# Patient Record
Sex: Male | Born: 1940
Health system: Southern US, Community
[De-identification: ages and names within clinical notes are randomized; demographics above are authoritative.]

## PROBLEM LIST (undated history)

## (undated) DIAGNOSIS — E785 Hyperlipidemia, unspecified: Secondary | ICD-10-CM

## (undated) DIAGNOSIS — C801 Malignant (primary) neoplasm, unspecified: Secondary | ICD-10-CM

## (undated) DIAGNOSIS — I10 Essential (primary) hypertension: Secondary | ICD-10-CM

---

## 2005-04-13 ENCOUNTER — Ambulatory Visit: Admission: RE | Admit: 2005-04-13 | Discharge: 2005-05-04 | Payer: Self-pay | Admitting: Radiation Oncology

## 2005-06-29 ENCOUNTER — Ambulatory Visit: Admission: RE | Admit: 2005-06-29 | Discharge: 2005-09-27 | Payer: Self-pay | Admitting: Radiation Oncology

## 2005-06-29 ENCOUNTER — Encounter: Admission: RE | Admit: 2005-06-29 | Discharge: 2005-06-29 | Payer: Self-pay | Admitting: Urology

## 2005-08-01 ENCOUNTER — Ambulatory Visit (HOSPITAL_BASED_OUTPATIENT_CLINIC_OR_DEPARTMENT_OTHER): Admission: RE | Admit: 2005-08-01 | Discharge: 2005-08-01 | Payer: Self-pay | Admitting: Urology

## 2007-01-10 IMAGING — CR DG CHEST 2V
2 series · 2 of 2 positions shown · non-contrast
Comparison: None.

CLINICAL DATA: 64 year old with prostate cancer.  Pre op respiratory exam.
 CHEST ? 2 VIEW:

[w chest pa]
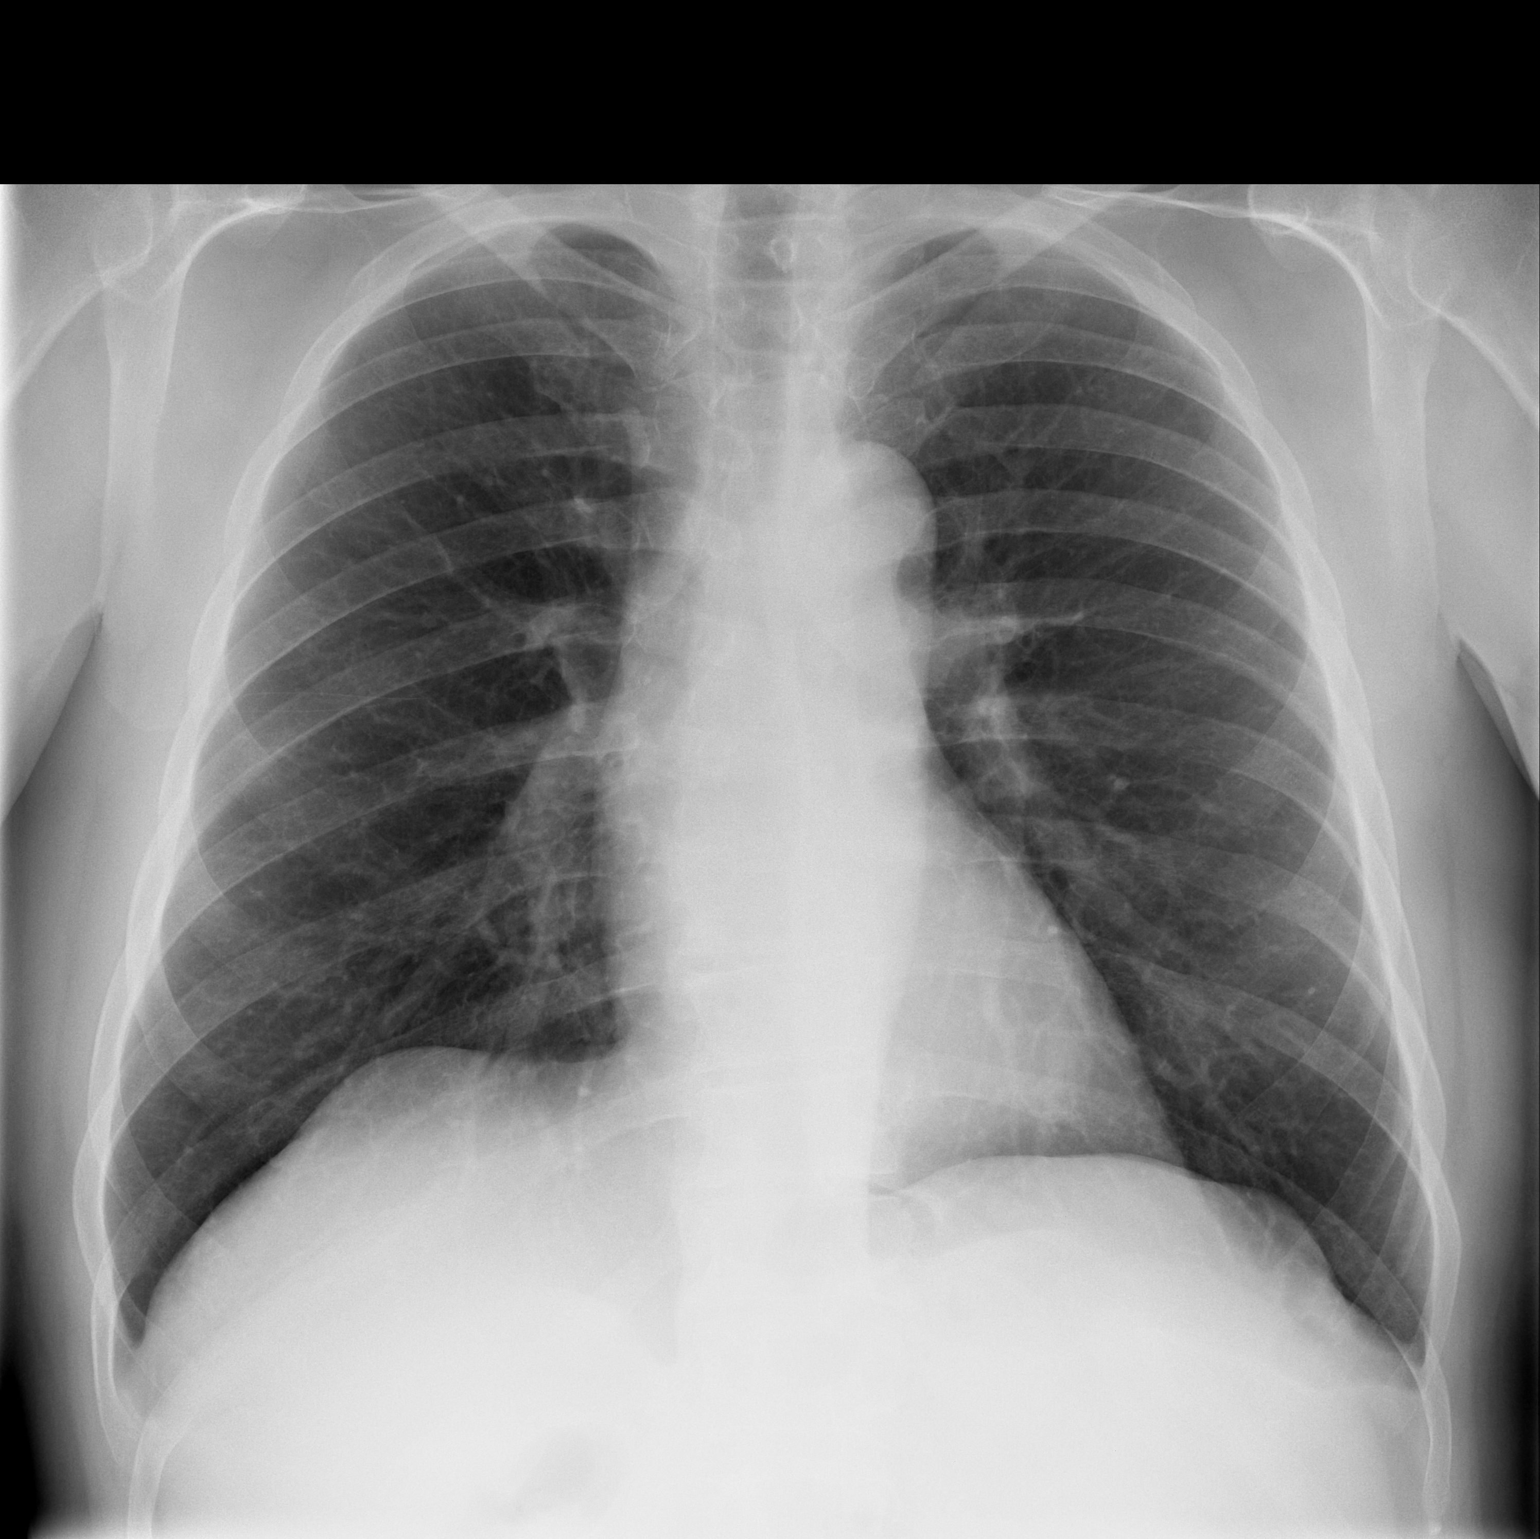

[w chest lat]
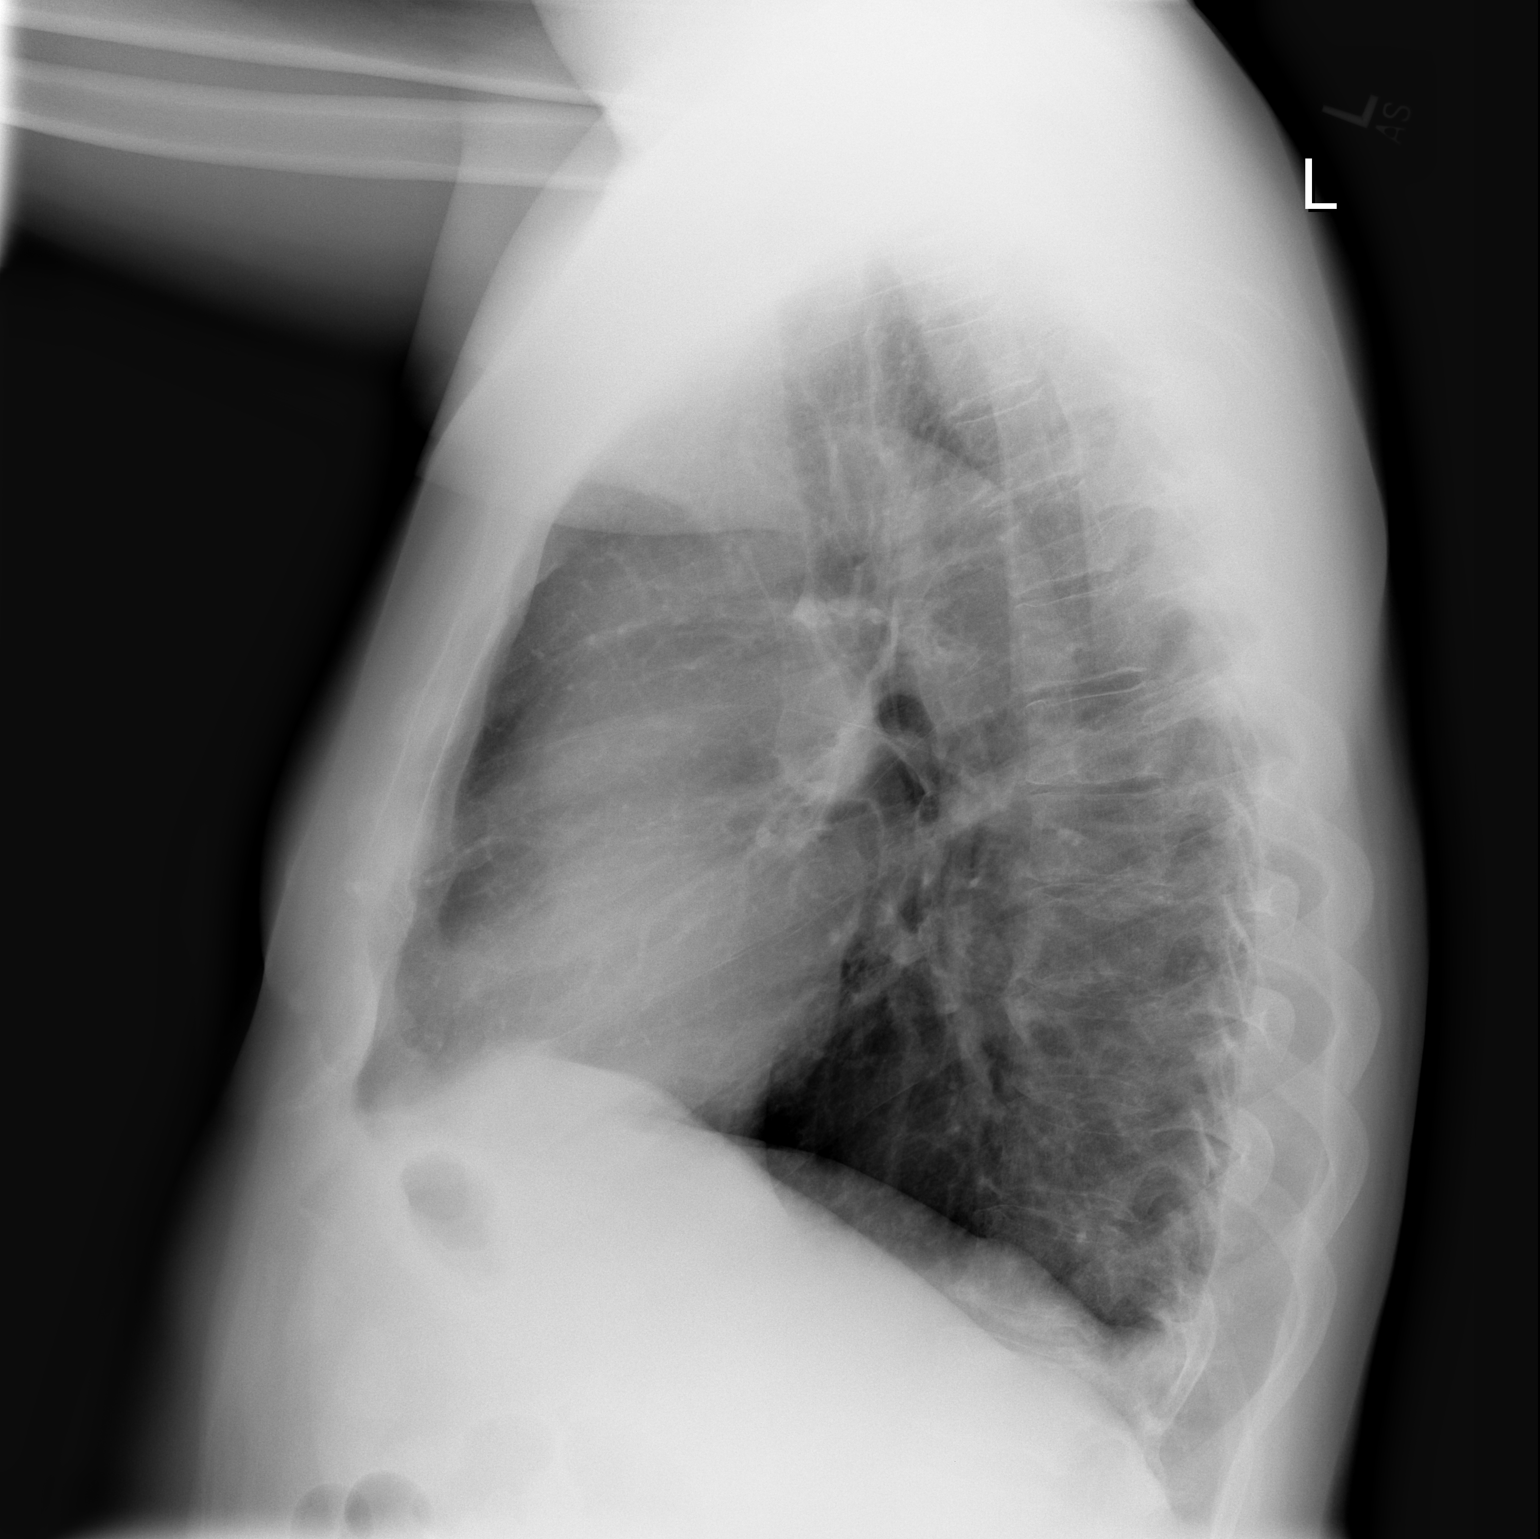

[2 of 2 positions shown; findings below may reference images not displayed]

FINDINGS: The cardiac silhouette, mediastinal and hilar contours are within normal limits.  There is mild eventration of both hemidiaphragms.  No acute pulmonary findings.  The bony structures are intact.
IMPRESSION: No acute cardiopulmonary findings.

## 2017-05-05 ENCOUNTER — Encounter (HOSPITAL_COMMUNITY): Payer: Self-pay | Admitting: *Deleted

## 2017-05-05 ENCOUNTER — Ambulatory Visit (HOSPITAL_COMMUNITY)
Admission: EM | Admit: 2017-05-05 | Discharge: 2017-05-05 | Disposition: A | Discharge status: Home / Self Care | Payer: Medicare Other

## 2017-05-05 DIAGNOSIS — R000 Tachycardia, unspecified: Secondary | ICD-10-CM | POA: Diagnosis not present

## 2017-05-05 DIAGNOSIS — I10 Essential (primary) hypertension: Secondary | ICD-10-CM | POA: Diagnosis not present

## 2017-05-05 HISTORY — DX: Essential (primary) hypertension: I10

## 2017-05-05 HISTORY — DX: Malignant (primary) neoplasm, unspecified: C80.1

## 2017-05-05 HISTORY — DX: Hyperlipidemia, unspecified: E78.5

## 2017-05-05 NOTE — Discharge Instructions (Signed)
Your blood pressure was elevated today in clinic. Please be sure to take blood pressure medications as prescribed. Please monitor your blood pressure at home or when you go to a [facility]. Please follow up with your primary care doctor to recheck blood pressure and discuss any need for medication changes.  ° °Please go to Emergency Room if you start to experience severe headache, vision changes, decreased urine production, chest pain, shortness of breath, speech slurring, one sided weakness. °

## 2017-05-05 NOTE — ED Triage Notes (Signed)
Patient reports that he was taking his HR and BP at home because he felt his heart rate was fast, denies sob with it just felt that it was racing. Patient states he was at home and just had got up to use restroom, no exertion. Patient states he feels okay now.

## 2017-05-05 NOTE — ED Provider Notes (Signed)
[facility]    CSN: [id number] Arrival date & time: 05/05/17  1815     History   Chief Complaint Chief Complaint  Patient presents with  . Tachycardia  . Hypertension    HPI Devin Villegas is a 77 y.o. male history of hyperlipidemia, previously hypertension, presenting today with concern for increased heart rate and elevated blood pressure.  States that earlier today while he was watching TV he felt like his heart was racing.  Heart rate was running in the 90s blood pressure was similar to what it was here, 185/82.  He denies any associated shortness of breath chest pain, lightheadedness, dizziness, nausea, vomiting.  He denies headache, changes in vision, facial drooping, difficulty speaking, one-sided weakness.  He is no longer feeling the racing sensation.  Patient is still relatively active, rides a stationary bike daily and plays golf.  Patient previously was on medication for high blood pressure, but has not needed medicine over the past 3-4 years.  Patient denies history of smoking or any issues with his lungs.  HPI  Past Medical History:  Diagnosis Date  . Cancer [facility])    prostate  . Hyperlipidemia   . Hypertension    no longer takes medication for HTN    There are no active problems to display for this patient.   History reviewed. No pertinent surgical history.     Home Medications    Prior to Admission medications   Medication Sig Start Date End Date Taking? Authorizing Provider  simvastatin (ZOCOR) 20 MG tablet Take 20 mg by mouth daily.   Yes [provider]    Family History History reviewed. No pertinent family history.  Social History Social History   Tobacco Use  . Smoking status: Never Smoker  . Smokeless tobacco: Never Used  Substance Use Topics  . Alcohol use: Not on file  . Drug use: Not on file     Allergies   Patient has no known allergies.   Review of Systems Review of Systems  Constitutional: Negative for  chills and fever.  HENT: Negative for ear pain and sore throat.   Eyes: Negative for visual disturbance.  Respiratory: Negative for cough and shortness of breath.   Cardiovascular: Negative for chest pain and palpitations.  Gastrointestinal: Negative for abdominal pain, nausea and vomiting.  Skin: Negative for rash.  Neurological: Negative for dizziness, syncope, speech difficulty, weakness, light-headedness and headaches.  All other systems reviewed and are negative.    Physical Exam Triage Vital Signs ED Triage Vitals [05/05/17 1849]  Enc Vitals Group     BP (!) 185/82     Pulse Rate 90     Resp 17     Temp 98.3 F (36.8 C)     Temp Source Oral     SpO2 93 %     Weight      Height      Head Circumference      Peak Flow      Pain Score      Pain Loc      Pain Edu?      Excl. in [place]?    No data found.  Updated Vital Signs BP (!) 185/82 (BP Location: Right Arm)   Pulse 90   Temp 98.3 F (36.8 C) (Oral)   Resp 17   SpO2 93%  Blood pressure rechecked and was 163/86; oxygen remaining stable around 93%. Visual Acuity Right Eye Distance:   Left Eye Distance:   Bilateral  Distance:    Right Eye Near:   Left Eye Near:    Bilateral Near:     Physical Exam  Constitutional: He is oriented to person, place, and time. He appears well-developed and well-nourished.  HENT:  Head: Normocephalic and atraumatic.  Eyes: Conjunctivae and EOM are normal. Pupils are equal, round, and reactive to light.  Neck: Neck supple.  Cardiovascular: Normal rate and regular rhythm.  Murmur heard. Pulmonary/Chest: Effort normal and breath sounds normal. No respiratory distress.  Breathing comfortably at rest, CTA BL  Abdominal: Soft. There is no tenderness.  Musculoskeletal: He exhibits no edema.  Neurological: He is alert and oriented to person, place, and time. No cranial nerve deficit.  No focal neuro deficit.  Cranial nerves II through XII grossly intact, strength 5/5 in shoulders hips  and knees bilaterally in all directions.  Normal coordination.  Normal gait.  Skin: Skin is warm and dry.  Psychiatric: He has a normal mood and affect.  Nursing note and vitals reviewed.    UC Treatments / Results  Labs (all labs ordered are listed, but only abnormal results are displayed) Labs Reviewed - No data to display  EKG  EKG Interpretation None       Radiology No results found.  Procedures Procedures (including critical care time)  Medications Ordered in UC Medications - No data to display   Initial Impression / Assessment and Plan / UC Course  I have reviewed the triage vital signs and the nursing notes.  Pertinent labs & imaging results that were available during my care of the patient were reviewed by me and considered in my medical decision making (see chart for details).     Patient with elevated blood pressure reading, history of hypertension not currently on medicines.  Patient asymptomatic, no red flags.  EKG normal sinus with occasional PVC.  No signs of ischemia or infarction.  Heart rate stable.  No signs or symptoms of stroke.  Will follow up with PCP and continue to monitor blood pressure at home. Discussed strict return precautions. Patient verbalized understanding and is agreeable with plan.   Final Clinical Impressions(s) / UC Diagnoses   Final diagnoses:  Hypertension, unspecified type    ED Discharge Orders    None       Controlled Substance Prescriptions [place] Controlled Substance Registry consulted? Not Applicable   Janith Lima, [place] 05/05/17 2115

## 2017-05-06 ENCOUNTER — Telehealth (HOSPITAL_COMMUNITY): Payer: Self-pay | Admitting: *Deleted

## 2019-03-17 ENCOUNTER — Ambulatory Visit: Payer: Medicare Other

## 2019-03-26 ENCOUNTER — Ambulatory Visit: Payer: Medicare Other | Attending: Internal Medicine

## 2019-03-26 DIAGNOSIS — Z23 Encounter for immunization: Secondary | ICD-10-CM | POA: Insufficient documentation

## 2019-03-26 NOTE — Progress Notes (Signed)
   Covid-19 Vaccination Clinic  Name:  Devin Villegas    MRN: [id number] DOB: [birth year]  03/26/2019  Devin Villegas was observed post Covid-19 immunization for 15 minutes without incidence. He was provided with Vaccine Information Sheet and instruction to access the V-Safe system.   Devin Villegas was instructed to call 911 with any severe reactions post vaccine: Marland Kitchen Difficulty breathing  . Swelling of your face and throat  . A fast heartbeat  . A bad rash all over your body  . Dizziness and weakness    Immunizations Administered    Name Date Dose VIS Date Route   Pfizer COVID-19 Vaccine 03/26/2019  4:32 PM 0.3 mL 01/30/2019 Intramuscular   Manufacturer: [place]   Lot: [id number]   [place]: [id number]

## 2019-04-03 ENCOUNTER — Ambulatory Visit: Payer: Medicare Other

## 2019-04-20 ENCOUNTER — Ambulatory Visit: Payer: Medicare Other | Attending: Internal Medicine

## 2019-04-20 DIAGNOSIS — Z23 Encounter for immunization: Secondary | ICD-10-CM | POA: Insufficient documentation

## 2019-04-20 NOTE — Progress Notes (Signed)
   Covid-19 Vaccination Clinic  Name:  Devin Villegas    MRN: [id number] DOB: December 26, 1940  04/20/2019  Mr. Devin Villegas was observed post Covid-19 immunization for 15 minutes without incidence. He was provided with Vaccine Information Sheet and instruction to access the V-Safe system.   Mr. Devin Villegas was instructed to call 911 with any severe reactions post vaccine: Marland Kitchen Difficulty breathing  . Swelling of your face and throat  . A fast heartbeat  . A bad rash all over your body  . Dizziness and weakness    Immunizations Administered    Name Date Dose VIS Date Route   Pfizer COVID-19 Vaccine 04/20/2019  2:43 PM 0.3 mL 01/30/2019 Intramuscular   Manufacturer: [place]   Lot: [id number]   [place]: [id number]

## 2019-12-07 NOTE — Progress Notes (Signed)
Oncology Nurse Navigator Documentation  Placed introductory call to new referral patient Devin Villegas  Introduced myself as the H&N oncology nurse navigator that works with Dr. Isidore Moos to whom he has been referred by Dr. Sarajane Jews. He confirmed understanding of referral.  Briefly explained my role as his navigator, provided my contact information.   Confirmed understanding of upcoming telephone appointment with Dr. Isidore Moos.  I encouraged him to call with questions/concerns as he moves forward with appts and procedures.    He verbalized understanding of information provided, expressed appreciation for my call.   Navigator Initial Assessment . Employment Status: retired . Currently on FMLA / STD:no . Living Situation: he lives alone . Support System: . PCP: . PCD: . Financial Concerns:yes, will talk to financial office after plan established . Transportation Needs: no . Sensory Deficits:no . Language Barriers/Interpreter Needed:  no . Ambulation Needs: no . DME Used in Home: no . Psychosocial Needs:  no . Concerns/Needs Understanding Cancer:  addressed/answered by navigator to best of ability . Self-Expressed Needs: no   Harlow Asa RN, BSN, OCN Head & Neck Oncology Nurse [place] at [facility] Phone # [phone]  Fax # [phone]

## 2019-12-07 NOTE — Progress Notes (Signed)
Radiation Oncology         [phone] ________________________________  Initial Outpatient Consultation by telephone.  The patient opted for telemedicine to maximize safety during the pandemic.  MyChart video was not obtainable.  Name: Devin Villegas MRN: [id number]  Date: 12/08/2019  DOB: 12-07-1940  [id number], Nathen May, MD  Griselda Miner, MD   REFERRING PHYSICIAN: Griselda Miner, MD  DIAGNOSIS:    ICD-10-CM   1. 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 Basal cell carcinoma of nose  C44.311   3. Basal cell carcinoma, scalp/neck  C44.41   4. Basal cell carcinoma of skin of left ear and external auricular canal  C44.219     Cancer Staging Basal cell carcinoma of nose Staging form: Cutaneous Carcinoma of the Head and Neck, AJCC 8th Edition - Clinical stage from 12/08/2019: Stage II (cT2, cN0, cM0) - Signed by Eppie Gibson, MD on 12/14/2019   CHIEF COMPLAINT: Here to discuss management of skin cancer  HISTORY OF PRESENT ILLNESS::Devin Villegas is a 79 y.o. male who presented to Dr. Sarajane Jews for evaluation of abnormal skin lesions. Biopsy on the date of 11/13/2019 showed basal cell carcinoma of the inferior medial external ear helix skin (infiltrative pattern), left inferior nasal root skin (nodular pattern), and right mid medial postauricular skin (nodular pattern) in addition to dysplastic compound nevus with moderate atypia (close to margin) of the right mid lateral back skin.  The patient was referred to me for my opinion regarding radiation therapy options.  Tentatively, there were plans to refer him to a Mohs surgeon at [facility] for consideration of the resection of the lesion of the left nose.  He may also undergo surgical resection of the right postauricular skin lesion by Dr Sarajane Jews.  The patient was not interested in losing additional tissue from his external left ear and therefore he is most enthusiastic about radiation therapy to the  ear and possibly a lesion of the left jaw that is adjacent and inferior to the ear.  Patient is also interested in radiation therapy to all of his facial lesions simultaneously if possible.  He is not enthusiastic about going to [facility] for surgery.  11/13/2019   Past/Anticipated interventions by patient's surgeon/dermatologist for current problematic lesion, if any:  Dr. Griselda Miner 12/03/2019    Past skin cancers, if any:   History of Blistering sunburns, if any: Yes, as a young child (nothing recently)  SAFETY ISSUES:  Prior radiation? Prostate seed implants by urologist (Dr. Luanne Bras at [facility]) in 2007  Pacemaker/ICD? No  Possible current pregnancy? N/A  Is the patient on methotrexate? No  Current Complaints / other details:  Nothing of note   PREVIOUS RADIATION THERAPY:  Prostate seed implants by urologist (Dr. Luanne Bras at [facility]) in 2007   PAST MEDICAL HISTORY:  has a past medical history of Cancer ([place]), Hyperlipidemia, and Hypertension.    PAST SURGICAL HISTORY:History reviewed. No pertinent surgical history.  FAMILY HISTORY: family history is not on file.  SOCIAL HISTORY:  reports that he has never smoked. He has never used smokeless tobacco. He reports that he does not drink alcohol and does not use drugs.  ALLERGIES: Patient has no known allergies.  MEDICATIONS:  Current Outpatient Medications  Medication Sig Dispense Refill  . aspirin EC 81 MG tablet Take 81 mg by mouth daily. Swallow whole.    . lisinopril (ZESTRIL) 5 MG tablet Take 5 mg by mouth daily.    Marland Kitchen  meloxicam (MOBIC) 15 MG tablet Take 15 mg by mouth daily.    . Multiple Vitamins-Minerals (SENIOR MULTIVITAMIN PLUS PO) Take 1 tablet by mouth daily.    . simvastatin (ZOCOR) 20 MG tablet Take 20 mg by mouth daily.    . fluorouracil (EFUDEX) 5 % cream Apply 1 application topically 2 (two) times daily. FLUOROURACIL 5% + CALCIPOTRIENE 0.005%  Apply to  areas of sun damage on face twice daily for 4-5 days (Patient not taking: Reported on 12/08/2019)     No current facility-administered medications for this encounter.    REVIEW OF SYSTEMS:  Notable for that above.   PHYSICAL EXAM:  vitals were not taken for this visit.   General: Alert and oriented, in no acute distress    Photographs below were provided by Dr. Sarajane Jews.  The lesions of concern are as follows: 1) left upper nose 2) right postauricular region 3) skin over left jaw 4) left helix, ear           LABORATORY DATA:  No results found for: WBC, HGB, HCT, MCV, PLT CMP  No results found for: NA, K, CL, CO2, GLUCOSE, BUN, CREATININE, CALCIUM, PROT, ALBUMIN, AST, ALT, ALKPHOS, BILITOT, GFRNONAA, GFRAA       RADIOGRAPHY: No results found.    IMPRESSION/PLAN: Skin cancer  This is a very nice 79 year old gentleman with multiple skin cancers of the head and neck region.  I had a lengthy discussion with the patient as well as Dr. Sarajane Jews.  The patient is highly motivated to undergo radiation therapy to all these lesions if he can avoid surgery and is particularly reluctant to undergo surgery in [place].  I think he is a good candidate for radiation therapy to the left external ear, skin over the left jaw, right posterior auricular region, and the left nose.  Anticipate we may use IMRT to reduce radiation exposure to the left globe when we treat the lesion over the upper nose.  Electrons will likely be used for the other lesions.  I discussed this case extensively with our dosimetrist and we will arrange for the patient to come in next week for treatment planning.  The patient is enthusiastic about this plan and understands that radiation would be given 5 days a week for 4 weeks.  Dr. Sarajane Jews and I have spoken to coordinate care so that the patient is seen by Dr. Sarajane Jews the morning of his CT simulation so that the lesions are clearly marked out and I can ensure they are  fully targeted.  Dr. Sarajane Jews will cancel the referral to [facility] for surgery.  Look forward to participating in the patient's care.  All questions were answered to his satisfaction.  He understands that side effects may include but not necessarily be limited to skin irritation, peeling of the skin, bleeding, fatigue, hair loss in the irradiated regions, eye irritation, salivary changes, permanent dry mouth, atrophy of the ear, injury to normal tissues in irradiated fields.  A consent form will be signed next week when he comes in person for treatment planning.    On date of service, in total, I spent 65 minutes on this encounter.  This included review of records, discussion with patient, and discussion with Dr. Sarajane Jews for coordination of care. Patient evaluated by telephone.  The patient opted for telemedicine to maximize safety during the pandemic.  MyChart video was not obtainable. The patient has given verbal consent for this type of encounter and has been advised to only accept  a meeting of this type in a secure network environment. The attendants for this meeting include Eppie Gibson  and WILFREDO [place]TER[place].  During the encounter, Eppie Gibson was located at [facility] Radiation Oncology Department.  Rosalio Loud was located at home.    __________________________________________   Eppie Gibson, MD  This document serves as a record of services personally performed by Eppie Gibson, MD. It was created on his behalf by Clerance Lav, a trained medical scribe. The creation of this record is based on the scribe's personal observations and the provider's statements to them. This document has been checked and approved by the attending provider.

## 2019-12-07 NOTE — Progress Notes (Signed)
Histology and Location of Primary Skin Cancer:  Basal cell carcinoma of helix of LEFT ear  11/13/2019   Past/Anticipated interventions by patient's surgeon/dermatologist for current problematic lesion, if any:  Dr. Griselda Miner 12/03/2019    Past skin cancers, if any:   History of Blistering sunburns, if any: Yes, as a young child (nothing recently)  SAFETY ISSUES:  Prior radiation? Prostate seed implants by urologist (Dr. Luanne Bras at [facility]) in 2007  Pacemaker/ICD? No  Possible current pregnancy? N/A  Is the patient on methotrexate? No  Current Complaints / other details:  Nothing of note

## 2019-12-08 ENCOUNTER — Encounter: Payer: Self-pay | Admitting: Radiation Oncology

## 2019-12-08 ENCOUNTER — Ambulatory Visit
Admission: RE | Admit: 2019-12-08 | Discharge: 2019-12-08 | Disposition: A | Discharge status: Home / Self Care | Payer: Medicare Other | Source: Ambulatory Visit | Attending: Radiation Oncology | Admitting: Radiation Oncology

## 2019-12-08 DIAGNOSIS — C4441 Basal cell carcinoma of skin of scalp and neck: Secondary | ICD-10-CM

## 2019-12-08 DIAGNOSIS — C44311 Basal cell carcinoma of skin of nose: Secondary | ICD-10-CM

## 2019-12-08 DIAGNOSIS — C44219 Basal cell carcinoma of skin of left ear and external auricular canal: Secondary | ICD-10-CM

## 2019-12-08 NOTE — Progress Notes (Signed)
Oncology Nurse Navigator Documentation  . Met with patient during initial telephone consult with Mr. Cass.  . Further introduced myself as his/their Navigator, explained my role as a member of the Care Team. . Assisted with post-consult appt scheduling. Marland Kitchen He verbalized understanding of information provided. . I encouraged them to call with questions/concerns moving forward.  Harlow Asa, RN, BSN, OCN Head & Neck Oncology Nurse [place] at [place] [phone]

## 2019-12-14 ENCOUNTER — Ambulatory Visit
Admission: RE | Admit: 2019-12-14 | Discharge: 2019-12-14 | Disposition: A | Discharge status: Home / Self Care | Payer: Medicare Other | Source: Ambulatory Visit | Attending: Radiation Oncology | Admitting: Radiation Oncology

## 2019-12-14 ENCOUNTER — Other Ambulatory Visit: Payer: Self-pay

## 2019-12-14 ENCOUNTER — Ambulatory Visit: Admission: RE | Admit: 2019-12-14 | Payer: Medicare Other | Source: Ambulatory Visit | Admitting: Radiation Oncology

## 2019-12-14 DIAGNOSIS — C44311 Basal cell carcinoma of skin of nose: Secondary | ICD-10-CM | POA: Insufficient documentation

## 2019-12-14 DIAGNOSIS — C44219 Basal cell carcinoma of skin of left ear and external auricular canal: Secondary | ICD-10-CM | POA: Insufficient documentation

## 2019-12-14 DIAGNOSIS — C4441 Basal cell carcinoma of skin of scalp and neck: Secondary | ICD-10-CM | POA: Insufficient documentation

## 2019-12-21 DIAGNOSIS — C44311 Basal cell carcinoma of skin of nose: Secondary | ICD-10-CM | POA: Diagnosis present

## 2019-12-21 DIAGNOSIS — C4441 Basal cell carcinoma of skin of scalp and neck: Secondary | ICD-10-CM | POA: Diagnosis present

## 2019-12-21 DIAGNOSIS — C44219 Basal cell carcinoma of skin of left ear and external auricular canal: Secondary | ICD-10-CM | POA: Insufficient documentation

## 2019-12-23 ENCOUNTER — Ambulatory Visit
Admission: RE | Admit: 2019-12-23 | Discharge: 2019-12-23 | Disposition: A | Discharge status: Home / Self Care | Payer: Medicare Other | Source: Ambulatory Visit | Attending: Radiation Oncology | Admitting: Radiation Oncology

## 2019-12-23 ENCOUNTER — Other Ambulatory Visit: Payer: Self-pay

## 2019-12-23 ENCOUNTER — Telehealth: Payer: Self-pay

## 2019-12-23 DIAGNOSIS — C44219 Basal cell carcinoma of skin of left ear and external auricular canal: Secondary | ICD-10-CM | POA: Diagnosis not present

## 2019-12-23 NOTE — Telephone Encounter (Signed)
Patient stopped by after XRT today because area on left ear that was biopsied by dermatologist continues to bleed, and he wanted to know how best to manage. Provided patient with some nonadherent pads and paper tape to keep area covered and protected. Patient stated he had applied some colorless iodine to the area and that seemed to stop bleeding, so he wanted to know if it was ok to continue to use. Consulted Dr. Isidore Moos who stated it would be best to reach out to patient's dermatologist to get their approval or other recommendations. Told patient I would reach out to Dr. Everett Graff office for guidance and call him back with an update.   Left VM with Dr. Everett Graff practice relaying above information and asking for call back with directions/guidance. Received return call from [place] who stated patient could either come into office to have Dr. Sarajane Jews cauterize the area, or he could apply a powder called "stops bleeding" and some pressure to promote coagulation.   Called and spoke with patient and relayed dermatologist's recommendations. Patient stated he would try to powder first, and if that didn't resolve the issue he would call Dr. Everett Graff office to make an appointment to have area cauterized. Patient denied any other needs at this time, but knows to call back should he have questions/concerns.

## 2019-12-24 ENCOUNTER — Ambulatory Visit
Admission: RE | Admit: 2019-12-24 | Discharge: 2019-12-24 | Disposition: A | Discharge status: Home / Self Care | Payer: Medicare Other | Source: Ambulatory Visit | Attending: Radiation Oncology | Admitting: Radiation Oncology

## 2019-12-24 DIAGNOSIS — C44219 Basal cell carcinoma of skin of left ear and external auricular canal: Secondary | ICD-10-CM | POA: Diagnosis not present

## 2019-12-25 ENCOUNTER — Ambulatory Visit
Admission: RE | Admit: 2019-12-25 | Discharge: 2019-12-25 | Disposition: A | Discharge status: Home / Self Care | Payer: Medicare Other | Source: Ambulatory Visit | Attending: Radiation Oncology | Admitting: Radiation Oncology

## 2019-12-25 DIAGNOSIS — C44219 Basal cell carcinoma of skin of left ear and external auricular canal: Secondary | ICD-10-CM | POA: Diagnosis not present

## 2019-12-28 ENCOUNTER — Ambulatory Visit
Admission: RE | Admit: 2019-12-28 | Discharge: 2019-12-28 | Disposition: A | Discharge status: Home / Self Care | Payer: Medicare Other | Source: Ambulatory Visit | Attending: Radiation Oncology | Admitting: Radiation Oncology

## 2019-12-28 DIAGNOSIS — C44219 Basal cell carcinoma of skin of left ear and external auricular canal: Secondary | ICD-10-CM

## 2019-12-28 DIAGNOSIS — C4441 Basal cell carcinoma of skin of scalp and neck: Secondary | ICD-10-CM

## 2019-12-28 DIAGNOSIS — C44311 Basal cell carcinoma of skin of nose: Secondary | ICD-10-CM

## 2019-12-28 MED ORDER — SONAFINE EX EMUL
1.0000 "application " | Freq: Two times a day (BID) | CUTANEOUS | Status: DC
  Administered 2019-12-28: 1 via TOPICAL

## 2019-12-28 NOTE — Progress Notes (Signed)
Pt here for patient teaching.    Pt given Radiation and You booklet, skin care instructions and Sonafine.    Reviewed areas of pertinence such as fatigue, hair loss, skin changes and earaches .   Pt able to give teach back of to pat skin, use unscented/gentle soap and drink plenty of water,apply Sonafine bid, avoid applying anything to skin within 4 hours of treatment and to use an electric razor if they must shave.   Pt demonstrated understanding and verbalizes understanding of information given and will contact nursing with any questions or concerns.    Http://rtanswers.org/treatmentinformation/whattoexpect/index

## 2019-12-29 ENCOUNTER — Ambulatory Visit
Admission: RE | Admit: 2019-12-29 | Discharge: 2019-12-29 | Disposition: A | Discharge status: Home / Self Care | Payer: Medicare Other | Source: Ambulatory Visit | Attending: Radiation Oncology | Admitting: Radiation Oncology

## 2019-12-29 DIAGNOSIS — C44219 Basal cell carcinoma of skin of left ear and external auricular canal: Secondary | ICD-10-CM | POA: Diagnosis not present

## 2019-12-30 ENCOUNTER — Ambulatory Visit
Admission: RE | Admit: 2019-12-30 | Discharge: 2019-12-30 | Disposition: A | Discharge status: Home / Self Care | Payer: Medicare Other | Source: Ambulatory Visit | Attending: Radiation Oncology | Admitting: Radiation Oncology

## 2019-12-30 DIAGNOSIS — C44219 Basal cell carcinoma of skin of left ear and external auricular canal: Secondary | ICD-10-CM | POA: Diagnosis not present

## 2019-12-31 ENCOUNTER — Ambulatory Visit
Admission: RE | Admit: 2019-12-31 | Discharge: 2019-12-31 | Disposition: A | Discharge status: Home / Self Care | Payer: Medicare Other | Source: Ambulatory Visit | Attending: Radiation Oncology | Admitting: Radiation Oncology

## 2019-12-31 DIAGNOSIS — C44219 Basal cell carcinoma of skin of left ear and external auricular canal: Secondary | ICD-10-CM | POA: Diagnosis not present

## 2020-01-01 ENCOUNTER — Ambulatory Visit
Admission: RE | Admit: 2020-01-01 | Discharge: 2020-01-01 | Disposition: A | Discharge status: Home / Self Care | Payer: Medicare Other | Source: Ambulatory Visit | Attending: Radiation Oncology | Admitting: Radiation Oncology

## 2020-01-01 DIAGNOSIS — C44219 Basal cell carcinoma of skin of left ear and external auricular canal: Secondary | ICD-10-CM | POA: Diagnosis not present

## 2020-01-04 ENCOUNTER — Ambulatory Visit
Admission: RE | Admit: 2020-01-04 | Discharge: 2020-01-04 | Disposition: A | Discharge status: Home / Self Care | Payer: Medicare Other | Source: Ambulatory Visit | Attending: Radiation Oncology | Admitting: Radiation Oncology

## 2020-01-04 DIAGNOSIS — C44219 Basal cell carcinoma of skin of left ear and external auricular canal: Secondary | ICD-10-CM | POA: Diagnosis not present

## 2020-01-05 ENCOUNTER — Ambulatory Visit
Admission: RE | Admit: 2020-01-05 | Discharge: 2020-01-05 | Disposition: A | Discharge status: Home / Self Care | Payer: Medicare Other | Source: Ambulatory Visit | Attending: Radiation Oncology | Admitting: Radiation Oncology

## 2020-01-05 DIAGNOSIS — C44219 Basal cell carcinoma of skin of left ear and external auricular canal: Secondary | ICD-10-CM | POA: Diagnosis not present

## 2020-01-06 ENCOUNTER — Ambulatory Visit
Admission: RE | Admit: 2020-01-06 | Discharge: 2020-01-06 | Disposition: A | Discharge status: Home / Self Care | Payer: Medicare Other | Source: Ambulatory Visit | Attending: Radiation Oncology | Admitting: Radiation Oncology

## 2020-01-06 DIAGNOSIS — C44219 Basal cell carcinoma of skin of left ear and external auricular canal: Secondary | ICD-10-CM | POA: Diagnosis not present

## 2020-01-07 ENCOUNTER — Ambulatory Visit
Admission: RE | Admit: 2020-01-07 | Discharge: 2020-01-07 | Disposition: A | Discharge status: Home / Self Care | Payer: Medicare Other | Source: Ambulatory Visit | Attending: Radiation Oncology | Admitting: Radiation Oncology

## 2020-01-07 DIAGNOSIS — C44219 Basal cell carcinoma of skin of left ear and external auricular canal: Secondary | ICD-10-CM | POA: Diagnosis not present

## 2020-01-08 ENCOUNTER — Ambulatory Visit
Admission: RE | Admit: 2020-01-08 | Discharge: 2020-01-08 | Disposition: A | Discharge status: Home / Self Care | Payer: Medicare Other | Source: Ambulatory Visit | Attending: Radiation Oncology | Admitting: Radiation Oncology

## 2020-01-08 DIAGNOSIS — C44219 Basal cell carcinoma of skin of left ear and external auricular canal: Secondary | ICD-10-CM | POA: Diagnosis not present

## 2020-01-11 ENCOUNTER — Ambulatory Visit
Admission: RE | Admit: 2020-01-11 | Discharge: 2020-01-11 | Disposition: A | Discharge status: Home / Self Care | Payer: Medicare Other | Source: Ambulatory Visit | Attending: Radiation Oncology | Admitting: Radiation Oncology

## 2020-01-11 ENCOUNTER — Other Ambulatory Visit: Payer: Self-pay

## 2020-01-11 DIAGNOSIS — C44219 Basal cell carcinoma of skin of left ear and external auricular canal: Secondary | ICD-10-CM | POA: Diagnosis not present

## 2020-01-12 ENCOUNTER — Ambulatory Visit
Admission: RE | Admit: 2020-01-12 | Discharge: 2020-01-12 | Disposition: A | Discharge status: Home / Self Care | Payer: Medicare Other | Source: Ambulatory Visit | Attending: Radiation Oncology | Admitting: Radiation Oncology

## 2020-01-12 DIAGNOSIS — C44219 Basal cell carcinoma of skin of left ear and external auricular canal: Secondary | ICD-10-CM | POA: Diagnosis not present

## 2020-01-13 ENCOUNTER — Ambulatory Visit
Admission: RE | Admit: 2020-01-13 | Discharge: 2020-01-13 | Disposition: A | Discharge status: Home / Self Care | Payer: Medicare Other | Source: Ambulatory Visit | Attending: Radiation Oncology | Admitting: Radiation Oncology

## 2020-01-13 DIAGNOSIS — C44219 Basal cell carcinoma of skin of left ear and external auricular canal: Secondary | ICD-10-CM | POA: Diagnosis not present

## 2020-01-18 ENCOUNTER — Ambulatory Visit
Admission: RE | Admit: 2020-01-18 | Discharge: 2020-01-18 | Disposition: A | Discharge status: Home / Self Care | Payer: Medicare Other | Source: Ambulatory Visit | Attending: Radiation Oncology | Admitting: Radiation Oncology

## 2020-01-18 DIAGNOSIS — C44219 Basal cell carcinoma of skin of left ear and external auricular canal: Secondary | ICD-10-CM | POA: Diagnosis not present

## 2020-01-19 ENCOUNTER — Ambulatory Visit
Admission: RE | Admit: 2020-01-19 | Discharge: 2020-01-19 | Disposition: A | Discharge status: Home / Self Care | Payer: Medicare Other | Source: Ambulatory Visit | Attending: Radiation Oncology | Admitting: Radiation Oncology

## 2020-01-19 DIAGNOSIS — C44219 Basal cell carcinoma of skin of left ear and external auricular canal: Secondary | ICD-10-CM | POA: Diagnosis not present

## 2020-01-20 ENCOUNTER — Ambulatory Visit
Admission: RE | Admit: 2020-01-20 | Discharge: 2020-01-20 | Disposition: A | Discharge status: Home / Self Care | Payer: Medicare Other | Source: Ambulatory Visit | Attending: Radiation Oncology | Admitting: Radiation Oncology

## 2020-01-20 DIAGNOSIS — C44219 Basal cell carcinoma of skin of left ear and external auricular canal: Secondary | ICD-10-CM | POA: Diagnosis present

## 2020-01-20 DIAGNOSIS — C44311 Basal cell carcinoma of skin of nose: Secondary | ICD-10-CM | POA: Insufficient documentation

## 2020-01-20 DIAGNOSIS — C4441 Basal cell carcinoma of skin of scalp and neck: Secondary | ICD-10-CM | POA: Insufficient documentation

## 2020-01-21 ENCOUNTER — Ambulatory Visit
Admission: RE | Admit: 2020-01-21 | Discharge: 2020-01-21 | Disposition: A | Discharge status: Home / Self Care | Payer: Medicare Other | Source: Ambulatory Visit | Attending: Radiation Oncology | Admitting: Radiation Oncology

## 2020-01-21 ENCOUNTER — Encounter: Payer: Self-pay | Admitting: Radiation Oncology

## 2020-01-21 DIAGNOSIS — C44219 Basal cell carcinoma of skin of left ear and external auricular canal: Secondary | ICD-10-CM | POA: Diagnosis not present

## 2020-02-10 NOTE — Progress Notes (Signed)
  Patient Name: Devin Villegas MRN: [id number] DOB: 02/23/1940 Referring Physician: Griselda Miner (Profile Not Attached) Date of Service: 01/21/2020 [facility] [place]                                                        End Of Treatment Note  Diagnoses: W65.681-EXNTZ cell carcinoma of skin of left ear and external auricular canal C44.311-Basal cell carcinoma of skin of nose C44.41-Basal cell carcinoma of skin of scalp and neck  Cancer Staging: Cancer Staging Basal cell carcinoma of nose Staging form: Cutaneous Carcinoma of the Head and Neck, AJCC 8th Edition - Clinical stage from 12/08/2019: Stage II (cT2, cN0, cM0) - Signed by Eppie Gibson, MD on 12/14/2019   Intent: Curative  Radiation Treatment Dates: 12/23/2019 through 01/21/2020 Site Technique Total Dose (Gy) Dose per Fx (Gy) Completed Fx Beam Energies  Nose: HN_nose IMRT 50/50 2.5 20/20 6X  Neck: HN_Left 3D 50/50 2.5 20/20 9E  Neck: HN_Right 3D 50/50 2.5 20/20 6E   Narrative: The patient tolerated radiation therapy relatively well to the left external ear, skin over the left jaw, right posterior auricular region, and the left nose.   Plan: The patient will follow-up with radiation oncology in 1 mo.  -----------------------------------  Eppie Gibson, MD

## 2020-02-23 DIAGNOSIS — C4441 Basal cell carcinoma of skin of scalp and neck: Secondary | ICD-10-CM | POA: Diagnosis not present

## 2020-02-23 DIAGNOSIS — L578 Other skin changes due to chronic exposure to nonionizing radiation: Secondary | ICD-10-CM | POA: Diagnosis not present

## 2020-02-23 DIAGNOSIS — C44519 Basal cell carcinoma of skin of other part of trunk: Secondary | ICD-10-CM | POA: Diagnosis not present

## 2020-02-23 DIAGNOSIS — Z85828 Personal history of other malignant neoplasm of skin: Secondary | ICD-10-CM | POA: Diagnosis not present

## 2020-02-26 ENCOUNTER — Other Ambulatory Visit: Payer: Self-pay

## 2020-02-26 ENCOUNTER — Ambulatory Visit
Admission: RE | Admit: 2020-02-26 | Discharge: 2020-02-26 | Disposition: A | Discharge status: Home / Self Care | Payer: Medicare Other | Source: Ambulatory Visit | Attending: Radiation Oncology | Admitting: Radiation Oncology

## 2020-02-26 VITALS — BP 153/62 | HR 68 | Temp 97.2°F | Resp 18 | Wt 196.2 lb

## 2020-02-26 DIAGNOSIS — Z79899 Other long term (current) drug therapy: Secondary | ICD-10-CM | POA: Diagnosis not present

## 2020-02-26 DIAGNOSIS — R682 Dry mouth, unspecified: Secondary | ICD-10-CM | POA: Diagnosis not present

## 2020-02-26 DIAGNOSIS — Z7982 Long term (current) use of aspirin: Secondary | ICD-10-CM | POA: Insufficient documentation

## 2020-02-26 DIAGNOSIS — Z791 Long term (current) use of non-steroidal anti-inflammatories (NSAID): Secondary | ICD-10-CM | POA: Insufficient documentation

## 2020-02-26 DIAGNOSIS — C44219 Basal cell carcinoma of skin of left ear and external auricular canal: Secondary | ICD-10-CM

## 2020-02-26 DIAGNOSIS — C4441 Basal cell carcinoma of skin of scalp and neck: Secondary | ICD-10-CM

## 2020-02-26 DIAGNOSIS — C44311 Basal cell carcinoma of skin of nose: Secondary | ICD-10-CM | POA: Insufficient documentation

## 2020-02-26 NOTE — Progress Notes (Signed)
Mr. Pense returns today for 1 month follow-up after completing radiation to his nose and neck/left ear on 01/21/2020  Pain issues, if any: Patient denies Weight changes, if any:  Wt Readings from Last 3 Encounters:  02/26/20 196 lb 4 oz (89 kg)   Skin: Appears intact and well healed Dermatology F/U: Saw dermatologist this past Tuesday. Took another biopsy from the right, posterior portion of his neck. Also provided patient with a "sport wash" to use when bathing. Patient was instructed to continue to apply fluorouracil cream to arms and face for the next several weeks Other notable issues, if any: Reports a healthy appetite. Still dealing with dry mouth, but denies any issues eating/drinking. Reports he had blood-tinged sinus drainage during treatment, but that has resolved since radiation has finished. Denies any worsening of tinnitus. Overall he reports he feels well and is pleased with his recovery thus far  Vitals:   02/26/20 1008  BP: (!) 153/62  Pulse: 68  Resp: 18  Temp: (!) 97.2 F (36.2 C)  SpO2: 98%

## 2020-02-29 ENCOUNTER — Encounter: Payer: Self-pay | Admitting: Radiation Oncology

## 2020-02-29 NOTE — Progress Notes (Signed)
Radiation Oncology         ([phone]) [phone] ________________________________  Name: Devin Villegas MRN: [id number]  Date: 02/26/2020  DOB: 1940-09-03  Follow-Up Visit Note in person  CC: Mayra Neer, MD  Griselda Miner, MD  Diagnosis and Prior Radiotherapy:       ICD-10-CM   1. Basal cell carcinoma of skin of left ear and external auricular canal  C44.219   2. Basal cell carcinoma of nose  C44.311   3. Basal cell carcinoma, scalp/neck  C44.41     Intent: Curative  Radiation Treatment Dates: 12/23/2019 through 01/21/2020 Site Technique Total Dose (Gy) Dose per Fx (Gy) Completed Fx Beam Energies  Nose: HN_nose IMRT 50/50 2.5 20/20 6X  Neck: HN_Left 3D 50/50 2.5 20/20 9E  Neck: HN_Right 3D 50/50 2.5 20/20 6E   CHIEF COMPLAINT:  Here for follow-up and surveillance of skin cancer  Narrative:  The patient returns today for routine follow-up.   Devin Villegas returns today for 1 month follow-up after completing radiation to his nose and neck/left ear on 01/21/2020  Pain issues, if any: Patient denies Weight changes, if any:  Wt Readings from Last 3 Encounters:  02/26/20 196 lb 4 oz (89 kg)   Skin: Appears intact and well healed Dermatology F/U: Saw dermatologist this past Tuesday. Took another biopsy from the right, posterior portion of his neck. Also provided patient with a "sport wash" to use when bathing. Patient was instructed to continue to apply fluorouracil cream to arms and face for the next several weeks Other notable issues, if any: Reports a healthy appetite. Still dealing with dry mouth, but denies any issues eating/drinking. Reports he had blood-tinged sinus drainage during treatment, but that has resolved since radiation has finished. Denies any worsening of tinnitus. Overall he reports he feels well and is pleased with his recovery thus far    ALLERGIES:  has No Known Allergies.  Meds: Current Outpatient Medications  Medication Sig Dispense Refill  . aspirin EC 81 MG  tablet Take 81 mg by mouth daily. Swallow whole.    . Coenzyme Q10 (CO Q 10) 100 MG CAPS Take 1 capsule by mouth daily.    . fluorouracil (EFUDEX) 5 % cream Apply 1 application topically 2 (two) times daily. FLUOROURACIL 5% + CALCIPOTRIENE 0.005%  Apply to areas of sun damage on face twice daily for 4-5 days    . lisinopril (ZESTRIL) 5 MG tablet Take 5 mg by mouth daily.    . meloxicam (MOBIC) 15 MG tablet Take 15 mg by mouth daily.    . Multiple Vitamins-Minerals (SENIOR MULTIVITAMIN PLUS PO) Take 1 tablet by mouth daily.    . simvastatin (ZOCOR) 20 MG tablet Take 20 mg by mouth daily.     No current facility-administered medications for this encounter.    Physical Findings: The patient is in no acute distress. Patient is alert and oriented. Wt Readings from Last 3 Encounters:  02/26/20 196 lb 4 oz (89 kg)    weight is 196 lb 4 oz (89 kg). His temporal temperature is 97.2 F (36.2 C) (abnormal). His blood pressure is 153/62 (abnormal) and his pulse is 68. His respiration is 18 and oxygen saturation is 98%. .  General: Alert and oriented, in no acute distress Skin: No residual tumor noted in the sites treated w/ RT: Complete clinical response at left external ear, skin over the left jaw, right posterior auricular region, and the left upper nose near his left eye. HEENT: no eye irritation  Neck: no palpable adenopathy in the periauricular, occipital, cervical, or SCV regions.  Lab Findings: No results found for: WBC, HGB, HCT, MCV, PLT  No results found for: TSH  Radiographic Findings: No results found.  Impression/Plan:     Healed well from RT for skin cancers with complete response.  Continue to follow closely with dermatology for full skin exams.  I will see him back PRN, particularly if there is a future lesion where surgery would be morbid/ suboptimal.  Other: We discussed measures to reduce the risk of infection during the COVID-19 pandemic. He is still due for his  booster. We could not reach his PCP to discuss this.  I recommend he pursue a booster ASAP. He prefers to get his via his PCP than here at the [facility]. He will call his PCP today.  On date of service, in total, I spent 20 minutes on this encounter. Patient was seen in person. _____________________________________   Eppie Gibson, MD

## 2020-05-10 DIAGNOSIS — R0789 Other chest pain: Secondary | ICD-10-CM | POA: Diagnosis not present

## 2020-05-10 DIAGNOSIS — W19XXXA Unspecified fall, initial encounter: Secondary | ICD-10-CM | POA: Diagnosis not present

## 2020-05-26 DIAGNOSIS — C441191 Basal cell carcinoma of skin of left upper eyelid, including canthus: Secondary | ICD-10-CM | POA: Diagnosis not present

## 2020-05-26 DIAGNOSIS — L570 Actinic keratosis: Secondary | ICD-10-CM | POA: Diagnosis not present

## 2020-05-26 DIAGNOSIS — L578 Other skin changes due to chronic exposure to nonionizing radiation: Secondary | ICD-10-CM | POA: Diagnosis not present

## 2020-05-26 DIAGNOSIS — C44319 Basal cell carcinoma of skin of other parts of face: Secondary | ICD-10-CM | POA: Diagnosis not present

## 2020-06-29 DIAGNOSIS — C44319 Basal cell carcinoma of skin of other parts of face: Secondary | ICD-10-CM | POA: Diagnosis not present

## 2020-07-07 DIAGNOSIS — C441191 Basal cell carcinoma of skin of left upper eyelid, including canthus: Secondary | ICD-10-CM | POA: Diagnosis not present

## 2020-08-25 DIAGNOSIS — L570 Actinic keratosis: Secondary | ICD-10-CM | POA: Diagnosis not present

## 2020-11-25 DIAGNOSIS — L570 Actinic keratosis: Secondary | ICD-10-CM | POA: Diagnosis not present

## 2020-11-25 DIAGNOSIS — C44319 Basal cell carcinoma of skin of other parts of face: Secondary | ICD-10-CM | POA: Diagnosis not present

## 2020-12-06 DIAGNOSIS — I10 Essential (primary) hypertension: Secondary | ICD-10-CM | POA: Diagnosis not present

## 2020-12-06 DIAGNOSIS — E782 Mixed hyperlipidemia: Secondary | ICD-10-CM | POA: Diagnosis not present

## 2020-12-06 DIAGNOSIS — K627 Radiation proctitis: Secondary | ICD-10-CM | POA: Diagnosis not present

## 2020-12-06 DIAGNOSIS — M179 Osteoarthritis of knee, unspecified: Secondary | ICD-10-CM | POA: Diagnosis not present

## 2020-12-06 DIAGNOSIS — Z0000 Encounter for general adult medical examination without abnormal findings: Secondary | ICD-10-CM | POA: Diagnosis not present

## 2021-12-11 DIAGNOSIS — K627 Radiation proctitis: Secondary | ICD-10-CM | POA: Diagnosis not present

## 2021-12-11 DIAGNOSIS — I10 Essential (primary) hypertension: Secondary | ICD-10-CM | POA: Diagnosis not present

## 2021-12-11 DIAGNOSIS — Z0000 Encounter for general adult medical examination without abnormal findings: Secondary | ICD-10-CM | POA: Diagnosis not present

## 2021-12-11 DIAGNOSIS — E782 Mixed hyperlipidemia: Secondary | ICD-10-CM | POA: Diagnosis not present

## 2021-12-11 DIAGNOSIS — M179 Osteoarthritis of knee, unspecified: Secondary | ICD-10-CM | POA: Diagnosis not present

## 2022-04-27 DIAGNOSIS — M25561 Pain in right knee: Secondary | ICD-10-CM | POA: Diagnosis not present

## 2022-04-30 ENCOUNTER — Ambulatory Visit
Admission: RE | Admit: 2022-04-30 | Discharge: 2022-04-30 | Disposition: A | Discharge status: Home / Self Care | Payer: Medicare Other | Source: Ambulatory Visit | Attending: Sports Medicine | Admitting: Sports Medicine

## 2022-04-30 ENCOUNTER — Other Ambulatory Visit: Payer: Self-pay | Admitting: Sports Medicine

## 2022-04-30 DIAGNOSIS — M25561 Pain in right knee: Secondary | ICD-10-CM

## 2022-04-30 DIAGNOSIS — M25461 Effusion, right knee: Secondary | ICD-10-CM | POA: Diagnosis not present

## 2022-04-30 DIAGNOSIS — M1711 Unilateral primary osteoarthritis, right knee: Secondary | ICD-10-CM | POA: Diagnosis not present

## 2022-05-28 DIAGNOSIS — M1711 Unilateral primary osteoarthritis, right knee: Secondary | ICD-10-CM | POA: Diagnosis not present

## 2022-11-12 DIAGNOSIS — C44329 Squamous cell carcinoma of skin of other parts of face: Secondary | ICD-10-CM | POA: Diagnosis not present

## 2022-11-12 DIAGNOSIS — C44319 Basal cell carcinoma of skin of other parts of face: Secondary | ICD-10-CM | POA: Diagnosis not present

## 2022-11-12 DIAGNOSIS — Z85828 Personal history of other malignant neoplasm of skin: Secondary | ICD-10-CM | POA: Diagnosis not present

## 2022-11-12 DIAGNOSIS — C44212 Basal cell carcinoma of skin of right ear and external auricular canal: Secondary | ICD-10-CM | POA: Diagnosis not present

## 2022-11-12 DIAGNOSIS — L570 Actinic keratosis: Secondary | ICD-10-CM | POA: Diagnosis not present

## 2022-11-12 DIAGNOSIS — L578 Other skin changes due to chronic exposure to nonionizing radiation: Secondary | ICD-10-CM | POA: Diagnosis not present

## 2022-12-17 DIAGNOSIS — C44319 Basal cell carcinoma of skin of other parts of face: Secondary | ICD-10-CM | POA: Diagnosis not present

## 2022-12-17 DIAGNOSIS — C44329 Squamous cell carcinoma of skin of other parts of face: Secondary | ICD-10-CM | POA: Diagnosis not present

## 2022-12-20 DIAGNOSIS — E782 Mixed hyperlipidemia: Secondary | ICD-10-CM | POA: Diagnosis not present

## 2022-12-20 DIAGNOSIS — R809 Proteinuria, unspecified: Secondary | ICD-10-CM | POA: Diagnosis not present

## 2022-12-20 DIAGNOSIS — Z0000 Encounter for general adult medical examination without abnormal findings: Secondary | ICD-10-CM | POA: Diagnosis not present

## 2022-12-20 DIAGNOSIS — M179 Osteoarthritis of knee, unspecified: Secondary | ICD-10-CM | POA: Diagnosis not present

## 2022-12-20 DIAGNOSIS — I10 Essential (primary) hypertension: Secondary | ICD-10-CM | POA: Diagnosis not present

## 2022-12-20 DIAGNOSIS — K627 Radiation proctitis: Secondary | ICD-10-CM | POA: Diagnosis not present

## 2023-02-07 DIAGNOSIS — M25561 Pain in right knee: Secondary | ICD-10-CM | POA: Diagnosis not present

## 2023-02-28 DIAGNOSIS — C44612 Basal cell carcinoma of skin of right upper limb, including shoulder: Secondary | ICD-10-CM | POA: Diagnosis not present

## 2023-02-28 DIAGNOSIS — C434 Malignant melanoma of scalp and neck: Secondary | ICD-10-CM | POA: Diagnosis not present

## 2023-02-28 DIAGNOSIS — D485 Neoplasm of uncertain behavior of skin: Secondary | ICD-10-CM | POA: Diagnosis not present

## 2023-02-28 DIAGNOSIS — Z85820 Personal history of malignant melanoma of skin: Secondary | ICD-10-CM | POA: Diagnosis not present

## 2023-02-28 DIAGNOSIS — C4359 Malignant melanoma of other part of trunk: Secondary | ICD-10-CM | POA: Diagnosis not present

## 2023-02-28 DIAGNOSIS — L570 Actinic keratosis: Secondary | ICD-10-CM | POA: Diagnosis not present

## 2023-03-19 DIAGNOSIS — C4359 Malignant melanoma of other part of trunk: Secondary | ICD-10-CM | POA: Diagnosis not present

## 2023-03-19 DIAGNOSIS — L988 Other specified disorders of the skin and subcutaneous tissue: Secondary | ICD-10-CM | POA: Diagnosis not present

## 2023-05-07 DIAGNOSIS — E782 Mixed hyperlipidemia: Secondary | ICD-10-CM | POA: Diagnosis not present

## 2023-05-07 DIAGNOSIS — I10 Essential (primary) hypertension: Secondary | ICD-10-CM | POA: Diagnosis not present

## 2023-05-07 DIAGNOSIS — J988 Other specified respiratory disorders: Secondary | ICD-10-CM | POA: Diagnosis not present

## 2023-12-10 DIAGNOSIS — R809 Proteinuria, unspecified: Secondary | ICD-10-CM | POA: Diagnosis not present

## 2023-12-10 DIAGNOSIS — K627 Radiation proctitis: Secondary | ICD-10-CM | POA: Diagnosis not present

## 2023-12-10 DIAGNOSIS — Z0000 Encounter for general adult medical examination without abnormal findings: Secondary | ICD-10-CM | POA: Diagnosis not present

## 2023-12-10 DIAGNOSIS — M179 Osteoarthritis of knee, unspecified: Secondary | ICD-10-CM | POA: Diagnosis not present

## 2023-12-10 DIAGNOSIS — E782 Mixed hyperlipidemia: Secondary | ICD-10-CM | POA: Diagnosis not present

## 2023-12-10 DIAGNOSIS — I10 Essential (primary) hypertension: Secondary | ICD-10-CM | POA: Diagnosis not present
